# Patient Record
Sex: Male | Born: 1965 | Race: White | Hispanic: No | Marital: Married | Smoking: Never smoker
Health system: Southern US, Community
[De-identification: ages and names within clinical notes are randomized; demographics above are authoritative.]

## PROBLEM LIST (undated history)

## (undated) DIAGNOSIS — E7800 Pure hypercholesterolemia, unspecified: Secondary | ICD-10-CM

## (undated) DIAGNOSIS — S93432A Sprain of tibiofibular ligament of left ankle, initial encounter: Secondary | ICD-10-CM

## (undated) DIAGNOSIS — E785 Hyperlipidemia, unspecified: Secondary | ICD-10-CM

## (undated) DIAGNOSIS — T8859XA Other complications of anesthesia, initial encounter: Secondary | ICD-10-CM

## (undated) DIAGNOSIS — G4730 Sleep apnea, unspecified: Secondary | ICD-10-CM

## (undated) DIAGNOSIS — Z9889 Other specified postprocedural states: Secondary | ICD-10-CM

## (undated) DIAGNOSIS — M1990 Unspecified osteoarthritis, unspecified site: Secondary | ICD-10-CM

## (undated) DIAGNOSIS — E119 Type 2 diabetes mellitus without complications: Secondary | ICD-10-CM

## (undated) DIAGNOSIS — T4145XA Adverse effect of unspecified anesthetic, initial encounter: Secondary | ICD-10-CM

## (undated) DIAGNOSIS — R112 Nausea with vomiting, unspecified: Secondary | ICD-10-CM

## (undated) HISTORY — PX: CYST REMOVAL HAND: SHX6279

---

## 2017-06-27 ENCOUNTER — Emergency Department (HOSPITAL_COMMUNITY): Payer: Worker's Compensation

## 2017-06-27 ENCOUNTER — Other Ambulatory Visit: Payer: Self-pay

## 2017-06-27 ENCOUNTER — Encounter (HOSPITAL_COMMUNITY): Payer: Self-pay | Admitting: Emergency Medicine

## 2017-06-27 ENCOUNTER — Emergency Department (HOSPITAL_COMMUNITY)
Admission: EM | Admit: 2017-06-27 | Discharge: 2017-06-27 | Disposition: A | Discharge status: Home / Self Care | Payer: Worker's Compensation | Attending: Emergency Medicine | Admitting: Emergency Medicine

## 2017-06-27 DIAGNOSIS — X509XXA Other and unspecified overexertion or strenuous movements or postures, initial encounter: Secondary | ICD-10-CM | POA: Diagnosis not present

## 2017-06-27 DIAGNOSIS — Y9389 Activity, other specified: Secondary | ICD-10-CM | POA: Insufficient documentation

## 2017-06-27 DIAGNOSIS — S82891A Other fracture of right lower leg, initial encounter for closed fracture: Secondary | ICD-10-CM

## 2017-06-27 DIAGNOSIS — S99911A Unspecified injury of right ankle, initial encounter: Secondary | ICD-10-CM | POA: Diagnosis present

## 2017-06-27 DIAGNOSIS — E119 Type 2 diabetes mellitus without complications: Secondary | ICD-10-CM | POA: Diagnosis not present

## 2017-06-27 DIAGNOSIS — Y92812 Truck as the place of occurrence of the external cause: Secondary | ICD-10-CM | POA: Insufficient documentation

## 2017-06-27 DIAGNOSIS — Y998 Other external cause status: Secondary | ICD-10-CM | POA: Insufficient documentation

## 2017-06-27 HISTORY — DX: Type 2 diabetes mellitus without complications: E11.9

## 2017-06-27 HISTORY — DX: Hyperlipidemia, unspecified: E78.5

## 2017-06-27 LAB — CBG MONITORING, ED: GLUCOSE-CAPILLARY: 195 mg/dL — AB (ref 65–99)

## 2017-06-27 MED ORDER — IBUPROFEN 600 MG PO TABS: 600.0000 mg | ORAL_TABLET | Freq: Four times a day (QID) | ORAL | 0 refills | Status: DC | PRN

## 2017-06-27 MED ORDER — HYDROCODONE-ACETAMINOPHEN 5-325 MG PO TABS: 1.0000 | ORAL_TABLET | Freq: Four times a day (QID) | ORAL | 0 refills | Status: DC | PRN

## 2017-06-27 NOTE — Discharge Instructions (Signed)
Ibuprofen for pain.  Norco for severe pain as needed.  Follow-up with orthopedic specialist next week for recheck.  Keep your ankle elevated, ice several times a day.  Use crutches for ambulation

## 2017-06-27 NOTE — Progress Notes (Signed)
Orthopedic Tech Progress Note Patient Details:  Eric Matthews 27-Aug-1965 [id number]  Ortho Devices Type of Ortho Device: Ace wrap, Post (short leg) splint Ortho Device/Splint Location: rle Ortho Device/Splint Interventions: Application   Post Interventions Instructions Provided: Care of device   Eric Matthews 06/27/2017, 8:51 AM Pt already has crutches; RN notified

## 2017-06-27 NOTE — ED Triage Notes (Signed)
Pt. Stated a bar slipped out of rachet , the bar fell right on my leg right above my ankle. My leg folded back.

## 2017-06-27 NOTE — ED Notes (Signed)
Patient transported to X-ray 

## 2017-06-27 NOTE — ED Notes (Signed)
Pt doesn't want pain meds at this time.

## 2017-06-27 NOTE — ED Provider Notes (Signed)
[facility] Provider Note   CSN: [id number] Arrival date & time: 06/27/17  [id number]     History   Chief Complaint Chief Complaint  Patient presents with  . Leg Pain  . Leg Injury    HPI Conception OmsChris Hofman is a 51 y.o. male.  HPI Conception OmsChris Belger is a 51 y.o. male presents to emergency department complaining of right ankle pain.  Patient states that he was tying the strap on his truck this morning, when a bar fell and he states he stepped out of the truck the wrong way twisting his ankle.  He states the bar did not hit him.  He states he felt like his leg bent in half at the ankle joint.  He is reporting pain and swelling of the ankle, denies any numbness or weakness to the foot.  He denies any pain at his knee.  He denies any other injuries.  No prior injury to the same leg.  Past Medical History:  Diagnosis Date  . Diabetes mellitus without complication (HCC)   . Hyperlipidemia with target low density lipoprotein (LDL) cholesterol less than 100 mg/dL     There are no active problems to display for this patient.   History reviewed. No pertinent surgical history.     Home Medications    Prior to Admission medications   Not on File    Family History No family history on file.  Social History Social History   Tobacco Use  . Smoking status: Never Smoker  . Smokeless tobacco: Never Used  Substance Use Topics  . Alcohol use: Yes  . Drug use: No     Allergies   Patient has no allergy information on record.   Review of Systems Review of Systems  Constitutional: Negative for chills and fever.  Musculoskeletal: Positive for arthralgias and joint swelling.  Neurological: Negative for weakness and numbness.     Physical Exam Updated Vital Signs BP 138/89 (BP Location: Left Arm)   Pulse 85   Temp 98.7 F (37.1 C) (Oral)   Resp 18   Ht 5\' 9"  (1.753 m)   Wt 112.9 kg (249 lb)   SpO2 99%   BMI 36.77 kg/m   Physical Exam    Constitutional: He appears well-developed and well-nourished. No distress.  Eyes: Conjunctivae are normal.  Neck: Neck supple.  Cardiovascular: Normal rate.  Pulmonary/Chest: No respiratory distress.  Abdominal: He exhibits no distension.  Musculoskeletal:  Right ankle swelling over lateral malleolus. Tender to palpation over lateral malleolus. No tenderness to palpation over medial malleolus. Joint stable with negative anterior or posterior drawer signs. Pain with ankle dorsiflexion, plantarflexion, inversion. Normal foot exam with no swelling, tenderness.     Skin: Skin is warm and dry.  Nursing note and vitals reviewed.    ED Treatments / Results  Labs (all labs ordered are listed, but only abnormal results are displayed) Labs Reviewed  CBG MONITORING, ED - Abnormal; Notable for the following components:      Result Value   Glucose-Capillary 195 (*)    All other components within normal limits    EKG  EKG Interpretation None       Radiology Dg Ankle Complete Right  Result Date: 06/27/2017 CLINICAL DATA:  Pain following twisting injury EXAM: RIGHT ANKLE - COMPLETE 3+ VIEW COMPARISON:  None. FINDINGS: Frontal, oblique, and lateral views were obtained. There is soft tissue swelling laterally. There is a spiral fracture of the distal fibular diaphysis extending to involve  the distal fibular diaphysis - metaphysis junction. Alignment near anatomic. No other fracture. No joint effusion. There is no appreciable joint space narrowing. No erosive change. There are posterior and inferior calcaneal spurs. IMPRESSION: Soft tissue swelling laterally with spiral fracture distal fibula. Alignment near anatomic. No other fracture. Ankle mortise appears intact. There are prominent calcaneal spurs. Electronically Signed   By: Bretta BangWilliam  Woodruff III M.D.   On: 06/27/2017 08:05    Procedures Procedures (including critical care time)  Medications Ordered in ED Medications - No data to  display   Initial Impression / Assessment and Plan / ED Course  I have reviewed the triage vital signs and the nursing notes.  Pertinent labs & imaging results that were available during my care of the patient were reviewed by me and considered in my medical decision making (see chart for details).     Patient in the emergency department with a right ankle injury.  He is neurovascularly intact.  Will get x-rays to rule out fracture.  X-ray showing nondisplaced spiral fracture of the right distal fibula.  Patient placed in a splint, crutches provided.  Home with pain medications, follow-up with orthopedics.  Neurovascularly intact at time of discharge.  Vitals:   06/27/17 0722 06/27/17 0854  BP: 138/89 124/89  Pulse: 85   Resp: 18   Temp: 98.7 F (37.1 C)   TempSrc: Oral   SpO2: 99%   Weight: 112.9 kg (249 lb)   Height: 5\' 9"  (1.753 m)      Final Clinical Impressions(s) / ED Diagnoses   Final diagnoses:  Closed fracture of right ankle, initial encounter    ED Discharge Orders    None       Jaynie CrumbleKirichenko, Kasch Borquez, PA-C 06/27/17 1515    Mesner, Barbara CowerJason, MD 06/27/17 1702

## 2018-06-10 ENCOUNTER — Emergency Department (HOSPITAL_COMMUNITY)
Admission: EM | Admit: 2018-06-10 | Discharge: 2018-06-10 | Disposition: A | Discharge status: Home / Self Care | Payer: PRIVATE HEALTH INSURANCE | Attending: Emergency Medicine | Admitting: Emergency Medicine

## 2018-06-10 ENCOUNTER — Emergency Department (HOSPITAL_COMMUNITY): Payer: PRIVATE HEALTH INSURANCE

## 2018-06-10 ENCOUNTER — Encounter (HOSPITAL_COMMUNITY): Payer: Self-pay | Admitting: *Deleted

## 2018-06-10 DIAGNOSIS — E785 Hyperlipidemia, unspecified: Secondary | ICD-10-CM | POA: Diagnosis not present

## 2018-06-10 DIAGNOSIS — S82392A Other fracture of lower end of left tibia, initial encounter for closed fracture: Secondary | ICD-10-CM

## 2018-06-10 DIAGNOSIS — S8255XA Nondisplaced fracture of medial malleolus of left tibia, initial encounter for closed fracture: Secondary | ICD-10-CM | POA: Diagnosis not present

## 2018-06-10 DIAGNOSIS — Y9289 Other specified places as the place of occurrence of the external cause: Secondary | ICD-10-CM | POA: Diagnosis not present

## 2018-06-10 DIAGNOSIS — Y33XXXA Other specified events, undetermined intent, initial encounter: Secondary | ICD-10-CM | POA: Insufficient documentation

## 2018-06-10 DIAGNOSIS — Y990 Civilian activity done for income or pay: Secondary | ICD-10-CM | POA: Diagnosis not present

## 2018-06-10 DIAGNOSIS — S8992XA Unspecified injury of left lower leg, initial encounter: Secondary | ICD-10-CM | POA: Diagnosis present

## 2018-06-10 DIAGNOSIS — E119 Type 2 diabetes mellitus without complications: Secondary | ICD-10-CM | POA: Insufficient documentation

## 2018-06-10 DIAGNOSIS — S82445A Nondisplaced spiral fracture of shaft of left fibula, initial encounter for closed fracture: Secondary | ICD-10-CM | POA: Insufficient documentation

## 2018-06-10 DIAGNOSIS — Y9389 Activity, other specified: Secondary | ICD-10-CM | POA: Insufficient documentation

## 2018-06-10 MED ORDER — HYDROCODONE-ACETAMINOPHEN 5-325 MG PO TABS: 1.0000 | ORAL_TABLET | Freq: Four times a day (QID) | ORAL | 0 refills | Status: AC | PRN

## 2018-06-10 NOTE — ED Triage Notes (Signed)
Pt in after fall at work, he tripped on a bottom step and rolled his left ankle, c/o pain to his left lower leg

## 2018-06-10 NOTE — Progress Notes (Signed)
Orthopedic Tech Progress Note Patient Details:  Conception OmsChris Eberle August 05, 1965 [id number] PA order short leg splint. Ortho Devices Type of Ortho Device: Crutches, Short leg splint Ortho Device/Splint Location: LLE Ortho Device/Splint Interventions: Ordered, Application, Adjustment   Post Interventions Patient Tolerated: Well Instructions Provided: Care of device   Jennye MoccasinHughes, Fynn Adel Craig 06/10/2018, 12:25 PM

## 2018-06-10 NOTE — ED Notes (Signed)
Patient transported to CT 

## 2018-06-10 NOTE — ED Triage Notes (Signed)
Ortho tech in room to apply splint 

## 2018-06-10 NOTE — ED Notes (Signed)
Pt to xray

## 2018-06-10 NOTE — ED Provider Notes (Signed)
[facility] EMERGENCY DEPARTMENT Provider Note   CSN: [id number] Arrival date & time: 06/10/18  0706     History   Chief Complaint Chief Complaint  Patient presents with  . Fall    HPI Eric Matthews is a 52 y.o. male.  52 year old male presents with left lower leg injury.  Patient states that he was carrying a box down a flight of stairs at work and when he got to the bottom step there was some wood on the steps that caused him to roll his left ankle and fall.  Patient reports pain to the mid to distal left lower leg and lateral ankle.  Patient denies hitting his head, any injury of the neck or back, any pain in the upper extremities.  No other complaints or concerns.  Patient states that he already took 1 g of Tylenol and 2 ibuprofen this morning before work.     Past Medical History:  Diagnosis Date  . Diabetes mellitus without complication (HCC)   . Hyperlipidemia with target low density lipoprotein (LDL) cholesterol less than 100 mg/dL     There are no active problems to display for this patient.   History reviewed. No pertinent surgical history.      Home Medications    Prior to Admission medications   Medication Sig Start Date End Date Taking? Authorizing Provider  HYDROcodone-acetaminophen (NORCO) 5-325 MG tablet Take 1 tablet by mouth every 6 (six) hours as needed for moderate pain. 06/10/18   Jeannie Fend, PA-C  ibuprofen (ADVIL,MOTRIN) 600 MG tablet Take 1 tablet (600 mg total) by mouth every 6 (six) hours as needed. 06/27/17   Jaynie Crumble, PA-C    Family History History reviewed. No pertinent family history.  Social History Social History   Tobacco Use  . Smoking status: Never Smoker  . Smokeless tobacco: Never Used  Substance Use Topics  . Alcohol use: Yes  . Drug use: No     Allergies   Patient has no known allergies.   Review of Systems Review of Systems  Constitutional: Negative for fever.  Musculoskeletal:  Positive for arthralgias, gait problem and myalgias. Negative for back pain, neck pain and neck stiffness.  Skin: Negative for rash and wound.  Allergic/Immunologic: Positive for immunocompromised state.  Neurological: Negative for weakness and numbness.  Hematological: Does not bruise/bleed easily.  Psychiatric/Behavioral: Negative for confusion.  All other systems reviewed and are negative.    Physical Exam Updated Vital Signs BP (!) 148/87 (BP Location: Right Arm)   Pulse 73   Temp 98.5 F (36.9 C) (Oral)   Resp 16   SpO2 97%   Physical Exam  Constitutional: He is oriented to person, place, and time. He appears well-developed and well-nourished. No distress.  HENT:  Head: Normocephalic and atraumatic.  Cardiovascular: Intact distal pulses.  Pulmonary/Chest: Effort normal.  Musculoskeletal: He exhibits tenderness. He exhibits no deformity.       Left knee: Normal.       Left ankle: He exhibits decreased range of motion and ecchymosis. He exhibits no swelling, no deformity, no laceration and normal pulse. Tenderness. Lateral malleolus tenderness found. No medial malleolus, no head of 5th metatarsal and no proximal fibula tenderness found.       Feet:  Neurological: He is alert and oriented to person, place, and time. No sensory deficit.  Skin: Skin is warm and dry. He is not diaphoretic. No erythema.  Psychiatric: He has a normal mood and affect. His behavior is  normal.  Nursing note and vitals reviewed.    ED Treatments / Results  Labs (all labs ordered are listed, but only abnormal results are displayed) Labs Reviewed - No data to display  EKG None  Radiology Dg Tibia/fibula Left  Result Date: 06/10/2018 CLINICAL DATA:  Status post fall at work at which time his left leg folded under him. EXAM: LEFT TIBIA AND FIBULA - 2 VIEW COMPARISON:  Left ankle series of today's date FINDINGS: There's a mildly distracted spiral fracture of the proximal fibular diaphysis. More  distally the fibula is intact. There's disruption of the ankle joint mortise and a probable impacted fracture of the medial aspect of the the talus. The tibia appears intact. IMPRESSION: Acute spiral fracture of the proximal 1/3 of the shaft of the fibula. Disruption of the ankle joint mortise with probable impacted fracture of the medial aspect of the talus. Electronically Signed   By: David  [place] M.D.   On: 06/10/2018 08:48   Dg Ankle Complete Left  Result Date: 06/10/2018 CLINICAL DATA:  Tripped and fell over would with result that his leg folded under him. He now has pain and swelling over the left lower leg and left ankle. EXAM: LEFT ANKLE COMPLETE - 3+ VIEW COMPARISON:  None. FINDINGS: There's disruption of the ankle joint mortise. There are bony densities inferior to the lateral malleolus. A donor site is likely present from the medial aspect of the talus. No acute malleolar fracture is observed. A spur is present at the site of the posterior malleolus. The talus elsewhere and the calcaneus are normal. There are plantar and Achilles region calcaneal spurs. IMPRESSION: There is disruption of the ankle joint mortise and likely an impaction type fracture from the medial aspect of the talus is present. There is a large amount of soft tissue swelling. No fracture of the tibia or fibula is observed on this ankle series. However, on the accompanying tibia and fibula series there is a mildly displaced spiral fracture of the proximal shaft of the fibula. Electronically Signed   By: David  [place] M.D.   On: 06/10/2018 08:35   Ct Ankle Left Wo Contrast  Result Date: 06/10/2018 CLINICAL DATA:  Fall from steps.  Left ankle fracture. EXAM: CT OF THE LEFT ANKLE WITHOUT CONTRAST TECHNIQUE: Multidetector CT imaging of the left ankle was performed according to the standard protocol. Multiplanar CT image reconstructions were also generated. COMPARISON:  Radiographs same date. FINDINGS: Bones/Joint/Cartilage There is  a comminuted and mildly displaced intra-articular fracture involving the posterior aspect of the tibial plafond. This fracture extends towards the base of the medial malleolus, although does not involve it. Well corticated ossific density distal to the medial malleolus appears nonacute. There is no extension of the fracture into the distal tibiofibular joint which is mildly widened. There is also mild widening of the interval between the medial malleolus and the talus. Slightly angulated 4 mm ossific density anterior to the distal fibula may reflect an acute avulsion fracture, probably mediated by the anterior inferior tibiofibular ligament. No clear donor site identified. The distal fibula otherwise appears normal. There are possible tiny avulsion fractures along the navicular tuberosity. No other acute tarsal bone fractures are identified. The talar dome is intact. There are mild midfoot degenerative changes with fragmented osteophytes. Degenerative changes are present within the subtalar joint. Ligaments Suboptimally assessed by CT. As above, possible lateral avulsion fracture mediated by the anterior inferior tibiofibular ligament. Grossly intact anterior talofibular ligament. Muscles and Tendons The ankle tendons  appear intact without evidence of entrapment. Soft tissues Moderate medial and mild lateral soft tissue swelling without focal hematoma. IMPRESSION: 1. Comminuted and mildly displaced intra-articular fracture of the posterior tibial plafond peripherally. 2. Possible small avulsion fracture anterolaterally without clear donor site. Mild associated widening of the ankle mortise, suggesting possible ligamentous injury. 3. Possible tiny avulsion fractures from the navicular tuberosity. Intact talar dome and additional tarsal bones. 4. Hindfoot and midfoot degenerative changes. Electronically Signed   By: Carey BullocksWilliam  Veazey M.D.   On: 06/10/2018 11:15    Procedures Procedures (including critical care  time)  Medications Ordered in ED Medications - No data to display   Initial Impression / Assessment and Plan / ED Course  I have reviewed the triage vital signs and the nursing notes.  Pertinent labs & imaging results that were available during my care of the patient were reviewed by me and considered in my medical decision making (see chart for details).  Clinical Course as of Jun 10 1205  Wed Jun 10, 2018  1202 52yo male with left lower leg/ankle injury after a fall. TTP mid to distal lateral lower leg and lateral left ankle, DP pulse present, NVI, no open wounds. XR with proximal left fibula shaft fracture, ankle fracture. Discussed with Charma IgoMichael Jeffery, PA-C with orthopedics who has seen the patient, ordered and reviewed CT ankle. Plan is to place in posterior short leg splint with stirrup and follow up. Given rx for Norco (after review of narcotics database, no recent rx). Patient given dc instructions, will follow up with ortho.    [LM]    Clinical Course User Index [LM] Jeannie FendMurphy, Tiegan Terpstra A, PA-C   Final Clinical Impressions(s) / ED Diagnoses   Final diagnoses:  Closed nondisplaced spiral fracture of shaft of left fibula, initial encounter  Closed fracture of posterior malleolus of left tibia, initial encounter    ED Discharge Orders         Ordered    HYDROcodone-acetaminophen (NORCO) 5-325 MG tablet  Every 6 hours PRN     06/10/18 1146           Alden HippMurphy, Anjana Cheek A, PA-C 06/10/18 1206    Pricilla LovelessGoldston, Scott, MD 06/10/18 1549

## 2018-06-10 NOTE — Discharge Instructions (Signed)
Keep leg elevated when possible.  Ice over splint for 30 minutes 4x/day for 3 days.  Keep splint intact and dry.  Do not put weight down on left leg.

## 2018-06-10 NOTE — Consult Note (Addendum)
Reason for Consult:Left ankle fx Referring Physician: Clemens Lachman is an 52 y.o. male.  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 He thinks his foot everted and his leg collapsed under him. He had immediate left lower leg and ankle pain. He came to the ED for evaluation where x-rays showed a proximal fibular shaft fx and a talus fx and orthopedic surgery was consulted. He works as a [employer] architect. He had a right ankle fx last year that was treated (non-operatively) by Dr. Darrelyn Hillock at [facility] and would like to stay with them if possible.  Past Medical History:  Diagnosis Date  . Diabetes mellitus without complication (HCC)   . Hyperlipidemia with target low density lipoprotein (LDL) cholesterol less than 100 mg/dL     History reviewed. No pertinent surgical history.  History reviewed. No pertinent family history.  Social History:  reports that he has never smoked. He has never used smokeless tobacco. He reports that he drinks alcohol. He reports that he does not use drugs.  Allergies: No Known Allergies  Medications: I have reviewed the patient's current medications.  No results found for this or any previous visit (from the past 48 hour(s)).  Dg Tibia/fibula Left  Result Date: 06/10/2018 CLINICAL DATA:  Status post fall at work at which time his left leg folded under him. EXAM: LEFT TIBIA AND FIBULA - 2 VIEW COMPARISON:  Left ankle series of today's date FINDINGS: There's a mildly distracted spiral fracture of the proximal fibular diaphysis. More distally the fibula is intact. There's disruption of the ankle joint mortise and a probable impacted fracture of the medial aspect of the the talus. The tibia appears intact. IMPRESSION: Acute spiral fracture of the proximal 1/3 of the shaft of the fibula. Disruption of the ankle joint mortise with probable impacted fracture of the medial aspect of the talus. Electronically  Signed   By: David  [place] M.D.   On: 06/10/2018 08:48   Dg Ankle Complete Left  Result Date: 06/10/2018 CLINICAL DATA:  Tripped and fell over would with result that his leg folded under him. He now has pain and swelling over the left lower leg and left ankle. EXAM: LEFT ANKLE COMPLETE - 3+ VIEW COMPARISON:  None. FINDINGS: There's disruption of the ankle joint mortise. There are bony densities inferior to the lateral malleolus. A donor site is likely present from the medial aspect of the talus. No acute malleolar fracture is observed. A spur is present at the site of the posterior malleolus. The talus elsewhere and the calcaneus are normal. There are plantar and Achilles region calcaneal spurs. IMPRESSION: There is disruption of the ankle joint mortise and likely an impaction type fracture from the medial aspect of the talus is present. There is a large amount of soft tissue swelling. No fracture of the tibia or fibula is observed on this ankle series. However, on the accompanying tibia and fibula series there is a mildly displaced spiral fracture of the proximal shaft of the fibula. Electronically Signed   By: David  [place] M.D.   On: 06/10/2018 08:35    Review of Systems  Constitutional: Negative for weight loss.  HENT: Negative for ear discharge, ear pain, hearing loss and tinnitus.   Eyes: Negative for blurred vision, double vision, photophobia and pain.  Respiratory: Negative for cough, sputum production and shortness of breath.   Cardiovascular: Negative for chest pain.  Gastrointestinal:  Negative for abdominal pain, nausea and vomiting.  Genitourinary: Negative for dysuria, flank pain, frequency and urgency.  Musculoskeletal: Positive for joint pain (Left ankle, leg). Negative for back pain, falls, myalgias and neck pain.  Neurological: Negative for dizziness, tingling, sensory change, focal weakness, loss of consciousness and headaches.  Endo/Heme/Allergies: Does not bruise/bleed easily.   Psychiatric/Behavioral: Negative for depression, memory loss and substance abuse. The patient is not nervous/anxious.    Blood pressure (!) 148/87, pulse 73, temperature 98.5 F (36.9 C), temperature source Oral, resp. rate 16, SpO2 97 %. Physical Exam  Constitutional: He appears well-developed and well-nourished. No distress.  HENT:  Head: Normocephalic and atraumatic.  Eyes: Conjunctivae are normal. Right eye exhibits no discharge. Left eye exhibits no discharge. No scleral icterus.  Neck: Normal range of motion.  Cardiovascular: Normal rate and regular rhythm.  Respiratory: Effort normal. No respiratory distress.  Musculoskeletal:  LLE No traumatic wounds, ecchymosis, or rash  Mild TTP lateral lower leg, mod-severe TTP ankle, mod edema  No knee effusion  Knee stable to varus/ valgus and anterior/posterior stress  Sens DPN, SPN, TN intact  Motor EHL, ext, flex, evers 5/5  DP 2+, PT 0 (likely 2/2 edema)  Neurological: He is alert.  Skin: Skin is warm and dry. He is not diaphoretic.  Psychiatric: He has a normal mood and affect. His behavior is normal.    Assessment/Plan: Left maissoneuve fx with posterior malleolus fx -- Splint, NWB, will have f/u with Dr. Victorino DikeHewitt within 1 week for planning surgery    Freeman CaldronMichael J. Jeffery, PA-C Orthopedic Surgery [phone] 06/10/2018, 9:35 AM

## 2018-06-17 ENCOUNTER — Other Ambulatory Visit (HOSPITAL_COMMUNITY): Payer: Self-pay | Admitting: Orthopedic Surgery

## 2018-06-17 ENCOUNTER — Encounter (HOSPITAL_BASED_OUTPATIENT_CLINIC_OR_DEPARTMENT_OTHER): Payer: Self-pay | Admitting: *Deleted

## 2018-06-17 ENCOUNTER — Other Ambulatory Visit: Payer: Self-pay

## 2018-06-19 ENCOUNTER — Ambulatory Visit (HOSPITAL_BASED_OUTPATIENT_CLINIC_OR_DEPARTMENT_OTHER): Payer: PRIVATE HEALTH INSURANCE | Admitting: Anesthesiology

## 2018-06-19 ENCOUNTER — Encounter (HOSPITAL_BASED_OUTPATIENT_CLINIC_OR_DEPARTMENT_OTHER)
Admission: RE | Disposition: A | Discharge status: Home / Self Care | Payer: Self-pay | Source: Home / Self Care | Attending: Orthopedic Surgery

## 2018-06-19 ENCOUNTER — Ambulatory Visit (HOSPITAL_BASED_OUTPATIENT_CLINIC_OR_DEPARTMENT_OTHER)
Admission: RE | Admit: 2018-06-19 | Discharge: 2018-06-19 | Disposition: A | Discharge status: Home / Self Care | Payer: PRIVATE HEALTH INSURANCE | Attending: Orthopedic Surgery | Admitting: Orthopedic Surgery

## 2018-06-19 ENCOUNTER — Other Ambulatory Visit: Payer: Self-pay

## 2018-06-19 ENCOUNTER — Encounter (HOSPITAL_BASED_OUTPATIENT_CLINIC_OR_DEPARTMENT_OTHER): Payer: Self-pay | Admitting: *Deleted

## 2018-06-19 DIAGNOSIS — Z7982 Long term (current) use of aspirin: Secondary | ICD-10-CM | POA: Insufficient documentation

## 2018-06-19 DIAGNOSIS — W19XXXA Unspecified fall, initial encounter: Secondary | ICD-10-CM | POA: Diagnosis not present

## 2018-06-19 DIAGNOSIS — Z79899 Other long term (current) drug therapy: Secondary | ICD-10-CM | POA: Insufficient documentation

## 2018-06-19 DIAGNOSIS — G4730 Sleep apnea, unspecified: Secondary | ICD-10-CM | POA: Diagnosis not present

## 2018-06-19 DIAGNOSIS — E785 Hyperlipidemia, unspecified: Secondary | ICD-10-CM | POA: Insufficient documentation

## 2018-06-19 DIAGNOSIS — M1990 Unspecified osteoarthritis, unspecified site: Secondary | ICD-10-CM | POA: Diagnosis not present

## 2018-06-19 DIAGNOSIS — E119 Type 2 diabetes mellitus without complications: Secondary | ICD-10-CM | POA: Diagnosis not present

## 2018-06-19 DIAGNOSIS — Y939 Activity, unspecified: Secondary | ICD-10-CM | POA: Insufficient documentation

## 2018-06-19 DIAGNOSIS — S82442A Displaced spiral fracture of shaft of left fibula, initial encounter for closed fracture: Secondary | ICD-10-CM | POA: Insufficient documentation

## 2018-06-19 DIAGNOSIS — E7800 Pure hypercholesterolemia, unspecified: Secondary | ICD-10-CM | POA: Insufficient documentation

## 2018-06-19 DIAGNOSIS — Y990 Civilian activity done for income or pay: Secondary | ICD-10-CM | POA: Diagnosis not present

## 2018-06-19 DIAGNOSIS — Y929 Unspecified place or not applicable: Secondary | ICD-10-CM | POA: Insufficient documentation

## 2018-06-19 DIAGNOSIS — Z7984 Long term (current) use of oral hypoglycemic drugs: Secondary | ICD-10-CM | POA: Insufficient documentation

## 2018-06-19 DIAGNOSIS — S93432A Sprain of tibiofibular ligament of left ankle, initial encounter: Secondary | ICD-10-CM | POA: Diagnosis present

## 2018-06-19 DIAGNOSIS — X501XXA Overexertion from prolonged static or awkward postures, initial encounter: Secondary | ICD-10-CM | POA: Insufficient documentation

## 2018-06-19 HISTORY — PX: LIGAMENT REPAIR: SHX5444

## 2018-06-19 HISTORY — DX: Other specified postprocedural states: R11.2

## 2018-06-19 HISTORY — DX: Other complications of anesthesia, initial encounter: T88.59XA

## 2018-06-19 HISTORY — DX: Unspecified osteoarthritis, unspecified site: M19.90

## 2018-06-19 HISTORY — PX: SYNDESMOSIS REPAIR: SHX5182

## 2018-06-19 HISTORY — DX: Adverse effect of unspecified anesthetic, initial encounter: T41.45XA

## 2018-06-19 HISTORY — DX: Sleep apnea, unspecified: G47.30

## 2018-06-19 HISTORY — DX: Nausea with vomiting, unspecified: Z98.890

## 2018-06-19 HISTORY — DX: Pure hypercholesterolemia, unspecified: E78.00

## 2018-06-19 HISTORY — DX: Sprain of tibiofibular ligament of left ankle, initial encounter: S93.432A

## 2018-06-19 LAB — GLUCOSE, CAPILLARY: Glucose-Capillary: 124 mg/dL — ABNORMAL HIGH (ref 70–99)

## 2018-06-19 SURGERY — REPAIR, LIGAMENT
Anesthesia: General | Site: Ankle | Laterality: Left

## 2018-06-19 MED ORDER — DEXAMETHASONE SODIUM PHOSPHATE 10 MG/ML IJ SOLN
INTRAMUSCULAR | Status: AC
  Filled 2018-06-19: qty 1

## 2018-06-19 MED ORDER — CEFAZOLIN SODIUM-DEXTROSE 2-4 GM/100ML-% IV SOLN
INTRAVENOUS | Status: AC
  Filled 2018-06-19: qty 100

## 2018-06-19 MED ORDER — HYDROMORPHONE HCL 1 MG/ML IJ SOLN
INTRAMUSCULAR | Status: AC
  Filled 2018-06-19: qty 0.5

## 2018-06-19 MED ORDER — LACTATED RINGERS IV SOLN
INTRAVENOUS | Status: DC
  Administered 2018-06-19: 12:00:00 via INTRAVENOUS

## 2018-06-19 MED ORDER — FENTANYL CITRATE (PF) 100 MCG/2ML IJ SOLN
50.0000 ug | INTRAMUSCULAR | Status: AC | PRN
  Administered 2018-06-19: 100 ug via INTRAVENOUS
  Administered 2018-06-19: 25 ug via INTRAVENOUS
  Administered 2018-06-19: 50 ug via INTRAVENOUS

## 2018-06-19 MED ORDER — EPHEDRINE SULFATE 50 MG/ML IJ SOLN
INTRAMUSCULAR | Status: DC | PRN
  Administered 2018-06-19: 10 mg via INTRAVENOUS

## 2018-06-19 MED ORDER — FENTANYL CITRATE (PF) 100 MCG/2ML IJ SOLN: 25.0000 ug | INTRAMUSCULAR | Status: DC | PRN

## 2018-06-19 MED ORDER — MIDAZOLAM HCL 2 MG/2ML IJ SOLN
INTRAMUSCULAR | Status: AC
  Filled 2018-06-19: qty 2

## 2018-06-19 MED ORDER — ONDANSETRON HCL 4 MG/2ML IJ SOLN
INTRAMUSCULAR | Status: AC
  Filled 2018-06-19: qty 2

## 2018-06-19 MED ORDER — ONDANSETRON HCL 4 MG/2ML IJ SOLN: 4.0000 mg | Freq: Once | INTRAMUSCULAR | Status: DC | PRN

## 2018-06-19 MED ORDER — BUPIVACAINE HCL (PF) 0.5 % IJ SOLN
INTRAMUSCULAR | Status: DC | PRN
  Administered 2018-06-19: 40 mL via PERINEURAL

## 2018-06-19 MED ORDER — MIDAZOLAM HCL 2 MG/2ML IJ SOLN
1.0000 mg | INTRAMUSCULAR | Status: DC | PRN
  Administered 2018-06-19: 2 mg via INTRAVENOUS

## 2018-06-19 MED ORDER — CEFAZOLIN SODIUM-DEXTROSE 2-4 GM/100ML-% IV SOLN
2.0000 g | INTRAVENOUS | Status: AC
  Administered 2018-06-19: 2 g via INTRAVENOUS

## 2018-06-19 MED ORDER — PROPOFOL 10 MG/ML IV BOLUS
INTRAVENOUS | Status: AC
  Filled 2018-06-19: qty 20

## 2018-06-19 MED ORDER — FENTANYL CITRATE (PF) 100 MCG/2ML IJ SOLN
INTRAMUSCULAR | Status: AC
  Filled 2018-06-19: qty 2

## 2018-06-19 MED ORDER — CHLORHEXIDINE GLUCONATE 4 % EX LIQD: 60.0000 mL | Freq: Once | CUTANEOUS | Status: DC

## 2018-06-19 MED ORDER — SODIUM CHLORIDE 0.9 % IV SOLN: INTRAVENOUS | Status: DC

## 2018-06-19 MED ORDER — CLONIDINE HCL (ANALGESIA) 100 MCG/ML EP SOLN
EPIDURAL | Status: DC | PRN
  Administered 2018-06-19: 100 ug

## 2018-06-19 MED ORDER — HYDROCODONE-ACETAMINOPHEN 7.5-325 MG PO TABS: 1.0000 | ORAL_TABLET | Freq: Once | ORAL | Status: DC | PRN

## 2018-06-19 MED ORDER — ONDANSETRON HCL 4 MG/2ML IJ SOLN
INTRAMUSCULAR | Status: DC | PRN
  Administered 2018-06-19: 4 mg via INTRAVENOUS

## 2018-06-19 MED ORDER — MEPERIDINE HCL 25 MG/ML IJ SOLN: 6.2500 mg | INTRAMUSCULAR | Status: DC | PRN

## 2018-06-19 MED ORDER — SCOPOLAMINE 1 MG/3DAYS TD PT72
1.0000 | MEDICATED_PATCH | Freq: Once | TRANSDERMAL | Status: DC | PRN
  Administered 2018-06-19: 1.5 mg via TRANSDERMAL

## 2018-06-19 MED ORDER — ASPIRIN EC 81 MG PO TBEC: 81.0000 mg | DELAYED_RELEASE_TABLET | Freq: Every day | ORAL | Status: AC

## 2018-06-19 MED ORDER — DEXAMETHASONE SODIUM PHOSPHATE 10 MG/ML IJ SOLN
INTRAMUSCULAR | Status: DC | PRN
  Administered 2018-06-19: 5 mg via INTRAVENOUS

## 2018-06-19 MED ORDER — OXYCODONE HCL 5 MG PO TABS: 5.0000 mg | ORAL_TABLET | Freq: Once | ORAL | Status: DC

## 2018-06-19 MED ORDER — LIDOCAINE 2% (20 MG/ML) 5 ML SYRINGE
INTRAMUSCULAR | Status: DC | PRN
  Administered 2018-06-19: 60 mg via INTRAVENOUS

## 2018-06-19 MED ORDER — OXYCODONE HCL 5 MG PO TABS: 5.0000 mg | ORAL_TABLET | Freq: Three times a day (TID) | ORAL | 0 refills | Status: AC | PRN

## 2018-06-19 MED ORDER — HYDROMORPHONE HCL 1 MG/ML IJ SOLN
0.5000 mg | INTRAMUSCULAR | Status: DC | PRN
  Administered 2018-06-19 (×2): 0.5 mg via INTRAVENOUS

## 2018-06-19 MED ORDER — PROPOFOL 10 MG/ML IV BOLUS
INTRAVENOUS | Status: DC | PRN
  Administered 2018-06-19: 200 mg via INTRAVENOUS

## 2018-06-19 SURGICAL SUPPLY — 70 items
ANCHOR SUT 1.45 SZ 1 SHORT (Anchor) ×4 IMPLANT
BANDAGE ESMARK 6X9 LF (GAUZE/BANDAGES/DRESSINGS) ×2 IMPLANT
BLADE SURG 15 STRL LF DISP TIS (BLADE) ×4 IMPLANT
BLADE SURG 15 STRL SS (BLADE) ×4
BNDG COHESIVE 4X5 TAN STRL (GAUZE/BANDAGES/DRESSINGS) ×4 IMPLANT
BNDG COHESIVE 6X5 TAN STRL LF (GAUZE/BANDAGES/DRESSINGS) ×4 IMPLANT
BNDG ESMARK 4X9 LF (GAUZE/BANDAGES/DRESSINGS) IMPLANT
BNDG ESMARK 6X9 LF (GAUZE/BANDAGES/DRESSINGS) ×4
CANISTER SUCT 1200ML W/VALVE (MISCELLANEOUS) ×4 IMPLANT
CHLORAPREP W/TINT 26ML (MISCELLANEOUS) ×4 IMPLANT
COVER BACK TABLE 60X90IN (DRAPES) ×4 IMPLANT
COVER WAND RF STERILE (DRAPES) IMPLANT
CUFF TOURNIQUET SINGLE 34IN LL (TOURNIQUET CUFF) ×4 IMPLANT
DECANTER SPIKE VIAL GLASS SM (MISCELLANEOUS) IMPLANT
DEVICE FIXATION SYNDESMOSIS (Bone Implant) ×4 IMPLANT
DRAPE EXTREMITY T 121X128X90 (DRAPE) ×4 IMPLANT
DRAPE OEC MINIVIEW 54X84 (DRAPES) ×4 IMPLANT
DRAPE U-SHAPE 47X51 STRL (DRAPES) ×4 IMPLANT
DRSG MEPITEL 4X7.2 (GAUZE/BANDAGES/DRESSINGS) ×4 IMPLANT
DRSG PAD ABDOMINAL 8X10 ST (GAUZE/BANDAGES/DRESSINGS) ×8 IMPLANT
ELECT REM PT RETURN 9FT ADLT (ELECTROSURGICAL) ×4
ELECTRODE REM PT RTRN 9FT ADLT (ELECTROSURGICAL) ×2 IMPLANT
FIXATION ZIPTIGHT ANKLE SNDSMS (Ankle) ×2 IMPLANT
GAUZE SPONGE 4X4 12PLY STRL (GAUZE/BANDAGES/DRESSINGS) ×4 IMPLANT
GLOVE BIO SURGEON STRL SZ 6.5 (GLOVE) ×3 IMPLANT
GLOVE BIO SURGEON STRL SZ8 (GLOVE) ×4 IMPLANT
GLOVE BIO SURGEONS STRL SZ 6.5 (GLOVE) ×1
GLOVE BIOGEL PI IND STRL 7.0 (GLOVE) ×2 IMPLANT
GLOVE BIOGEL PI IND STRL 8 (GLOVE) ×4 IMPLANT
GLOVE BIOGEL PI INDICATOR 7.0 (GLOVE) ×2
GLOVE BIOGEL PI INDICATOR 8 (GLOVE) ×4
GLOVE ECLIPSE 8.0 STRL XLNG CF (GLOVE) ×4 IMPLANT
GOWN STRL REUS W/ TWL LRG LVL3 (GOWN DISPOSABLE) ×2 IMPLANT
GOWN STRL REUS W/ TWL XL LVL3 (GOWN DISPOSABLE) ×4 IMPLANT
GOWN STRL REUS W/TWL LRG LVL3 (GOWN DISPOSABLE) ×2
GOWN STRL REUS W/TWL XL LVL3 (GOWN DISPOSABLE) ×4
NEEDLE HYPO 22GX1.5 SAFETY (NEEDLE) IMPLANT
NS IRRIG 1000ML POUR BTL (IV SOLUTION) ×4 IMPLANT
PACK BASIN DAY SURGERY FS (CUSTOM PROCEDURE TRAY) ×4 IMPLANT
PAD CAST 4YDX4 CTTN HI CHSV (CAST SUPPLIES) ×2 IMPLANT
PADDING CAST ABS 4INX4YD NS (CAST SUPPLIES)
PADDING CAST ABS COTTON 4X4 ST (CAST SUPPLIES) IMPLANT
PADDING CAST COTTON 4X4 STRL (CAST SUPPLIES) ×2
PADDING CAST COTTON 6X4 STRL (CAST SUPPLIES) ×4 IMPLANT
PENCIL BUTTON HOLSTER BLD 10FT (ELECTRODE) ×4 IMPLANT
PLATE ACE 3.5MM 4HOLE (Plate) ×4 IMPLANT
SANITIZER HAND PURELL 535ML FO (MISCELLANEOUS) ×4 IMPLANT
SHEET MEDIUM DRAPE 40X70 STRL (DRAPES) ×4 IMPLANT
SLEEVE SCD COMPRESS KNEE MED (MISCELLANEOUS) ×4 IMPLANT
SPLINT FAST PLASTER 5X30 (CAST SUPPLIES) ×40
SPLINT PLASTER CAST FAST 5X30 (CAST SUPPLIES) ×40 IMPLANT
SPONGE LAP 18X18 RF (DISPOSABLE) ×4 IMPLANT
STOCKINETTE 6  STRL (DRAPES) ×2
STOCKINETTE 6 STRL (DRAPES) ×2 IMPLANT
SUCTION FRAZIER HANDLE 10FR (MISCELLANEOUS) ×2
SUCTION TUBE FRAZIER 10FR DISP (MISCELLANEOUS) ×2 IMPLANT
SUT ETHILON 3 0 PS 1 (SUTURE) ×4 IMPLANT
SUT FIBERWIRE #2 38 T-5 BLUE (SUTURE)
SUT MNCRL AB 3-0 PS2 18 (SUTURE) IMPLANT
SUT VIC AB 0 SH 27 (SUTURE) IMPLANT
SUT VIC AB 2-0 SH 27 (SUTURE) ×2
SUT VIC AB 2-0 SH 27XBRD (SUTURE) ×2 IMPLANT
SUTURE FIBERWR #2 38 T-5 BLUE (SUTURE) IMPLANT
SYR BULB 3OZ (MISCELLANEOUS) ×4 IMPLANT
SYR CONTROL 10ML LL (SYRINGE) IMPLANT
TOWEL GREEN STERILE FF (TOWEL DISPOSABLE) ×8 IMPLANT
TUBE CONNECTING 20'X1/4 (TUBING) ×1
TUBE CONNECTING 20X1/4 (TUBING) ×3 IMPLANT
UNDERPAD 30X30 (UNDERPADS AND DIAPERS) ×4 IMPLANT
ZIPTIGHT ANKLE SYNODESMOSS FIX (Ankle) ×4 IMPLANT

## 2018-06-19 NOTE — Anesthesia Procedure Notes (Signed)
Anesthesia Regional Block: Adductor canal block   Pre-Anesthetic Checklist: ,, timeout performed, Correct Patient, Correct Site, Correct Laterality, Correct Procedure, Correct Position, site marked, Risks and benefits discussed,  Surgical consent,  Pre-op evaluation,  At surgeon's request and post-op pain management  Laterality: Right  Prep: chloraprep       Needles:  Injection technique: Single-shot  Needle Type: Echogenic Needle     Needle Length: 9cm  Needle Gauge: 21     Additional Needles:   Procedures:,,,, ultrasound used (permanent image in chart),,,,  Narrative:  Start time: 06/19/2018 1:22 PM End time: 06/19/2018 1:27 PM Injection made incrementally with aspirations every 5 mL.  Performed by: Personally  Anesthesiologist: Cecile Hearingurk, Jerlyn Pain Edward, MD  Additional Notes: No pain on injection. No increased resistance to injection. Injection made in 5cc increments.  Good needle visualization.  Patient tolerated procedure well.

## 2018-06-19 NOTE — Progress Notes (Signed)
Assisted Dr. Turk with left, ultrasound guided, popliteal/saphenous block. Side rails up, monitors on throughout procedure. See vital signs in flow sheet. Tolerated Procedure well. 

## 2018-06-19 NOTE — Anesthesia Postprocedure Evaluation (Signed)
Anesthesia Post Note  Patient: Eric Matthews  Procedure(s) Performed: OPEN REDUCTION INTERNAL FIXATION (ORIF) left ankle syndesmosis; repair of deltoid ligament (Left Ankle) SYNDESMOSIS REPAIR left ankle (Left Ankle)     Patient location during evaluation: PACU Anesthesia Type: General Level of consciousness: awake and alert and oriented Pain management: pain level controlled Vital Signs Assessment: post-procedure vital signs reviewed and stable Respiratory status: spontaneous breathing, nonlabored ventilation and respiratory function stable Cardiovascular status: blood pressure returned to baseline and stable Postop Assessment: no apparent nausea or vomiting Anesthetic complications: no    Last Vitals:  Vitals:   06/19/18 1645 06/19/18 1700  BP: (!) 142/90 136/88  Pulse: 66 66  Resp: 19 14  Temp:    SpO2: 96% 96%    Last Pain:  Vitals:   06/19/18 1645  TempSrc:   PainSc: 7                  Jaleigh Mccroskey A.

## 2018-06-19 NOTE — Anesthesia Procedure Notes (Signed)
Procedure Name: LMA Insertion Date/Time: 06/19/2018 2:36 PM Performed by: Burna Cashonrad, Ho Parisi C, CRNA Pre-anesthesia Checklist: Patient identified, Emergency Drugs available, Suction available and Patient being monitored Patient Re-evaluated:Patient Re-evaluated prior to induction Oxygen Delivery Method: Circle system utilized Preoxygenation: Pre-oxygenation with 100% oxygen Induction Type: IV induction Ventilation: Mask ventilation without difficulty LMA: LMA inserted LMA Size: 5.0 Number of attempts: 1 Airway Equipment and Method: Bite block Placement Confirmation: positive ETCO2 Tube secured with: Tape Dental Injury: Teeth and Oropharynx as per pre-operative assessment

## 2018-06-19 NOTE — Anesthesia Preprocedure Evaluation (Addendum)
Anesthesia Evaluation  Patient identified by MRN, date of birth, ID band Patient awake    Reviewed: Allergy & Precautions, NPO status , Patient's Chart, lab work & pertinent test results  History of Anesthesia Complications (+) PONV and history of anesthetic complications  Airway Mallampati: II  TM Distance: >3 FB Neck ROM: Full    Dental  (+) Teeth Intact, Dental Advisory Given   Pulmonary sleep apnea and Continuous Positive Airway Pressure Ventilation ,    Pulmonary exam normal breath sounds clear to auscultation       Cardiovascular Exercise Tolerance: Good negative cardio ROS Normal cardiovascular exam Rhythm:Regular Rate:Normal     Neuro/Psych negative neurological ROS     GI/Hepatic negative GI ROS, Neg liver ROS,   Endo/Other  diabetes, Type 2, Oral Hypoglycemic AgentsObesity   Renal/GU negative Renal ROS     Musculoskeletal  (+) Arthritis , Osteoarthritis,    Abdominal   Peds  Hematology negative hematology ROS (+)   Anesthesia Other Findings Day of surgery medications reviewed with the patient.  Reproductive/Obstetrics                             Anesthesia Physical Anesthesia Plan  ASA: II  Anesthesia Plan: General   Post-op Pain Management:  Regional for Post-op pain   Induction: Intravenous  PONV Risk Score and Plan: 3 and Midazolam, Dexamethasone, Ondansetron, Scopolamine patch - Pre-op and Diphenhydramine  Airway Management Planned: LMA  Additional Equipment:   Intra-op Plan:   Post-operative Plan: Extubation in OR  Informed Consent: I have reviewed the patients History and Physical, chart, labs and discussed the procedure including the risks, benefits and alternatives for the proposed anesthesia with the patient or authorized representative who has indicated his/her understanding and acceptance.   Dental advisory given  Plan Discussed with:  CRNA  Anesthesia Plan Comments:        Anesthesia Quick Evaluation

## 2018-06-19 NOTE — Transfer of Care (Signed)
Immediate Anesthesia Transfer of Care Note  Patient: Eric Matthews  Procedure(s) Performed: OPEN REDUCTION INTERNAL FIXATION (ORIF) left ankle syndesmosis; repair of deltoid ligament (Left Ankle) SYNDESMOSIS REPAIR left ankle (Left Ankle)  Patient Location: PACU  Anesthesia Type:General and Regional  Level of Consciousness: awake and sedated  Airway & Oxygen Therapy: Patient Spontanous Breathing and Patient connected to face mask oxygen  Post-op Assessment: Report given to RN and Post -op Vital signs reviewed and stable  Post vital signs: Reviewed and stable  Last Vitals:  Vitals Value Taken Time  BP    Temp    Pulse    Resp    SpO2      Last Pain:  Vitals:   06/19/18 1210  TempSrc: Oral  PainSc: 2       Patients Stated Pain Goal: 1 (06/19/18 1210)  Complications: No apparent anesthesia complications

## 2018-06-19 NOTE — H&P (Signed)
Eric Matthews is an 52 y.o. male.   Chief Complaint: Left ankle pain HPI: The patient is a 52 year old male who injured his left ankle at work last week.  He stepped on somewhat at the bottom of stairs and twisted his ankle.  He describes a pronation external rotation pattern of injury.  Radiographs and a CT scan show a disruption of the syndesmosis as well as likely deltoid ligament rupture.  He also has a nondisplaced posterior malleolus fracture.  He presents now for operative treatment of these unstable ankle injuries.  Past Medical History:  Diagnosis Date  . Ankle syndesmosis disruption, left, initial encounter   . Arthritis   . Complication of anesthesia   . Diabetes mellitus without complication (HCC)   . Hypercholesteremia   . Hyperlipidemia with target low density lipoprotein (LDL) cholesterol less than 100 mg/dL   . PONV (postoperative nausea and vomiting)   . Sleep apnea    uses CPAP nightly    Past Surgical History:  Procedure Laterality Date  . CYST REMOVAL HAND Right    wrist ganglion    History reviewed. No pertinent family history. Social History:  reports that he has never smoked. He has never used smokeless tobacco. He reports current alcohol use. He reports that he does not use drugs.  Allergies:  Allergies  Allergen Reactions  . Codeine Nausea And Vomiting    Medications Prior to Admission  Medication Sig Dispense Refill  . aspirin EC 81 MG tablet Take 81 mg by mouth every other day.    . Cinnamon 500 MG TABS Take by mouth.    Marland Kitchen. HYDROcodone-acetaminophen (NORCO) 5-325 MG tablet Take 1 tablet by mouth every 6 (six) hours as needed for moderate pain. 15 tablet 0  . Krill Oil 1000 MG CAPS Take by mouth.    . metFORMIN (GLUCOPHAGE) 500 MG tablet Take by mouth daily with breakfast.    . pravastatin (PRAVACHOL) 20 MG tablet Take 20 mg by mouth daily.    . sitaGLIPtin (JANUVIA) 100 MG tablet Take 100 mg by mouth daily.    . Turmeric 500 MG CAPS Take by mouth.     . vitamin C (ASCORBIC ACID) 500 MG tablet Take 500 mg by mouth daily.      No results found for this or any previous visit (from the past 48 hour(s)). No results found.  ROS no recent fever, chills, nausea, vomiting or changes in his appetite  Blood pressure 125/71, pulse (!) 57, temperature 98.8 F (37.1 C), temperature source Oral, resp. rate 12, height 5\' 9"  (1.753 m), weight 112.4 kg, SpO2 98 %. Physical Exam  Well-nourished well-developed male in no apparent distress.  Alert and oriented x4.  Mood and affect are normal extraocular motions are intact.  Respirations are unlabored.  Gait is nonweightbearing on the left.  Left ankle is swollen and ecchymotic.  Skin wrinkles.  No blisters.  Pulses are palpable.  No lymphadenopathy.  Sensibility to light touch is intact dorsally and plantarly at the forefoot.  5 out of 5 strength in plantar flexion and dorsiflexion of the toes.  Tender to palpation at the syndesmosis anteriorly.  Assessment/Plan Left ankle syndesmosis disruption and probable deltoid ligament rupture. -To the operating room today for open treatment of the syndesmosis disruption with internal fixation and possible repair of the deltoid ligament.  The posterior malleolus fracture can be treated safely in closed fashion.  The risks and benefits of the alternative treatment options have been discussed in detail.  The patient wishes to proceed with surgery and specifically understands risks of bleeding, infection, nerve damage, blood clots, need for additional surgery, amputation and death.   Eric ArthursJohn Eric Solorio, MD 06/19/2018, 2:25 PM

## 2018-06-19 NOTE — Op Note (Signed)
06/19/2018  3:52 PM  PATIENT:  Eric Matthews  52 y.o. male  PRE-OPERATIVE DIAGNOSIS:  1.  Left ankle syndesmosis disruption 2.  Left deltoid ligament rupture 3.  Left proximal fibular fracture  POST-OPERATIVE DIAGNOSIS: Same  Procedure(s): 1.  Open treatment of left ankle syndesmosis disruption with internal fixation 2.  Stress examination of the left ankle under fluoroscopy 3.  Primary repair of left deltoid ligament rupture 4.  AP, mortise and lateral radiographs of the left ankle  SURGEON:  Toni ArthursJohn Affie Gasner, MD  ASSISTANT: None  ANESTHESIA:   General, regional  EBL:  minimal   TOURNIQUET:   Total Tourniquet Time Documented: Thigh (Left) - 52 minutes Total: Thigh (Left) - 52 minutes  COMPLICATIONS:  None apparent  DISPOSITION:  Extubated, awake and stable to recovery.  INDICATION FOR PROCEDURE: The patient is a 52 year old male who injured his left ankle at work last week.  He has a proximal fibula fracture and injury of his syndesmosis and likely a deltoid ligament rupture.  He presents now for open treatment of this unstable left ankle injury.  The risks and benefits of the alternative treatment options have been discussed in detail.  The patient wishes to proceed with surgery and specifically understands risks of bleeding, infection, nerve damage, blood clots, need for additional surgery, amputation and death.  PROCEDURE IN DETAIL:  After pre operative consent was obtained, and the correct operative site was identified, the patient was brought to the operating room and placed supine on the OR table.  Anesthesia was administered.  Pre-operative antibiotics were administered.  A surgical timeout was taken.  The left lower extremity was prepped and draped in standard sterile fashion with a tourniquet around the thigh.  The extremity was elevated and the tourniquet was inflated to 250 mmHg.  A longitudinal incision was made over the anterolateral fibula.  Dissection was carried down  through the subcutaneous tissues.  The syndesmosis was exposed and was noted to be completely disrupted with significant hematoma.  This was irrigated and all soft tissue debrided from the syndesmosis.  The syndesmosis was then reduced under direct vision and held with a King tong clamp.  AP and lateral radiographs confirmed appropriate reduction of the syndesmosis.  A 4 hole one third tubular plate was then contoured to fit the lateral malleolus.  It was positioned appropriately using fluoroscopic guidance.  2 [employer] zip tight anchors were inserted through all 4 cortices of the distal fibula and tibia that were tightened appropriately and the sutures trimmed.  The clamp was removed.  AP, mortise and lateral radiographs confirmed appropriate reduction of the syndesmosis.  Stress examination was then performed.  This revealed persistent instability at the medial gutter indicative of a deltoid ligament rupture.  A longitudinal incision was then made over the deltoid ligament.  Dissection was carried down through the subcutaneous tissues.  The superficial and deep portions of the deltoid were avulsed from the medial malleolus.  The cortical bone was roughened with a rongeur.  A 1.45 mm [employer] juggernaut anchor was inserted.  It was noted to have excellent purchase.  The anchor sutures were then passed through the deep layer of the deltoid ligament.  This was pulled up onto the medial malleolus and tied.  The suture was then passed through the superficial deltoid ligament and again pulled up onto the medial malleolus and tied.  Anterior and posterior portions of the joint capsule and posterior tibial tendon sheath were repaired with figure-of-eight sutures of 0  Vicryl.  Final AP, lateral and mortise radiographs confirmed appropriate reduction of the syndesmosis and ankle mortise.  The wounds were irrigated copiously and closed with 2-0 Vicryl and 3-0 nylon.  Sterile dressings were applied followed  by a well-padded short leg splint.  The tourniquet was released after application of the dressings at 52 minutes.  The patient was awakened from anesthesia and transported to the recovery room in stable condition.   FOLLOW UP PLAN: Nonweightbearing on the left lower extremity.  Follow-up in the office in 3 weeks for suture removal and conversion to a short leg cast.  Aspirin 81 mg p.o. twice daily for DVT prophylaxis.   RADIOGRAPHS: AP, lateral and mortise radiographs of the left ankle are obtained intraoperatively.  These show interval reduction and fixation of the syndesmosis and ankle mortise.

## 2018-06-19 NOTE — Discharge Instructions (Addendum)
Eric Hewitt, MD °[facility] ° °Please read the following information regarding your care after surgery. ° °Medications  °You only need a prescription for the narcotic pain medicine (ex. oxycodone, Percocet, Norco).  All of the other medicines listed below are available over the counter. °X Aleve 2 pills twice a day for the first 3 days after surgery. °X acetominophen (Tylenol) 650 mg every 4-6 hours as you need for minor to moderate pain °X oxycodone as prescribed for severe pain ° °Narcotic pain medicine (ex. oxycodone, Percocet, Vicodin) will cause constipation.  To prevent this problem, take the following medicines while you are taking any pain medicine. °X docusate sodium (Colace) 100 mg twice a day X senna (Senokot) 2 tablets twice a day ° °X To help prevent blood clots, take a baby aspirin (81 mg) twice a day for two weeks after surgery.  You should also get up every hour while you are awake to move around.   ° °Weight Bearing °? Bear weight when you are able on your operated leg or foot. °? Bear weight only on your operated foot in the post-op shoe. °X Do not bear any weight on the operated leg or foot. ° °Cast / Splint / Dressing °X Keep your splint, cast or dressing clean and dry.  Don’t put anything (coat hanger, pencil, etc) down inside of it.  If it gets damp, use a hair dryer on the cool setting to dry it.  If it gets soaked, call the office to schedule an appointment for a cast change. °? Remove your dressing 3 days after surgery and cover the incisions with dry dressings.   ° °After your dressing, cast or splint is removed; you may shower, but do not soak or scrub the wound.  Allow the water to run over it, and then gently pat it dry. ° °Swelling °It is normal for you to have swelling where you had surgery.  To reduce swelling and pain, keep your toes above your nose for at least 3 days after surgery.  It may be necessary to keep your foot or leg elevated for several weeks.  If it hurts,  it should be elevated. ° °Follow Up °Call my office at [phone] when you are discharged from the hospital or surgery center to schedule an appointment to be seen two weeks after surgery. ° °Call my office at [phone] if you develop a fever >101.5° F, nausea, vomiting, bleeding from the surgical site or severe pain.   ° ° ° °Post Anesthesia Home Care Instructions ° °Activity: °Get plenty of rest for the remainder of the day. A responsible individual must stay with you for 24 hours following the procedure.  °For the next 24 hours, DO NOT: °-Drive a car °-Operate machinery °-Drink alcoholic beverages °-Take any medication unless instructed by your physician °-Make any legal decisions or sign important papers. ° °Meals: °Start with liquid foods such as gelatin or soup. Progress to regular foods as tolerated. Avoid greasy, spicy, heavy foods. If nausea and/or vomiting occur, drink only clear liquids until the nausea and/or vomiting subsides. Call your physician if vomiting continues. ° °Special Instructions/Symptoms: °Your throat may feel dry or sore from the anesthesia or the breathing tube placed in your throat during surgery. If this causes discomfort, gargle with warm salt water. The discomfort should disappear within 24 hours. ° °If you had a scopolamine patch placed behind your ear for the management of post- operative nausea and/or vomiting: ° °1. The medication in the   patch is effective for 72 hours, after which it should be removed.  Wrap patch in a tissue and discard in the trash. Wash hands thoroughly with soap and water. °2. You may remove the patch earlier than 72 hours if you experience unpleasant side effects which may include dry mouth, dizziness or visual disturbances. °3. Avoid touching the patch. Wash your hands with soap and water after contact with the patch. °  ° ° °Regional Anesthesia Blocks ° °1. Numbness or the inability to move the "blocked" extremity may last from 3-48 hours after  placement. The length of time depends on the medication injected and your individual response to the medication. If the numbness is not going away after 48 hours, call your surgeon. ° °2. The extremity that is blocked will need to be protected until the numbness is gone and the  Strength has returned. Because you cannot feel it, you will need to take extra care to avoid injury. Because it may be weak, you may have difficulty moving it or using it. You may not know what position it is in without looking at it while the block is in effect. ° °3. For blocks in the legs and feet, returning to weight bearing and walking needs to be done carefully. You will need to wait until the numbness is entirely gone and the strength has returned. You should be able to move your leg and foot normally before you try and bear weight or walk. You will need someone to be with you when you first try to ensure you do not fall and possibly risk injury. ° °4. Bruising and tenderness at the needle site are common side effects and will resolve in a few days. ° °5. Persistent numbness or new problems with movement should be communicated to the surgeon or the [facility] ([phone])/ [facility] ([phone]). °

## 2018-06-19 NOTE — Anesthesia Procedure Notes (Signed)
Anesthesia Regional Block: Popliteal block   Pre-Anesthetic Checklist: ,, timeout performed, Correct Patient, Correct Site, Correct Laterality, Correct Procedure, Correct Position, site marked, Risks and benefits discussed,  Surgical consent,  Pre-op evaluation,  At surgeon's request and post-op pain management  Laterality: Lower and Left  Prep: chloraprep       Needles:  Injection technique: Single-shot  Needle Type: Echogenic Stimulator Needle     Needle Length: 9cm  Needle Gauge: 21     Additional Needles:   Procedures:,,,, ultrasound used (permanent image in chart),,,,  Narrative:  Start time: 06/19/2018 1:17 PM End time: 06/19/2018 1:22 PM Injection made incrementally with aspirations every 5 mL.  Performed by: Personally  Anesthesiologist: Cecile Hearingurk, Johnanthony Wilden Edward, MD  Additional Notes: No pain on injection. No increased resistance to injection. Injection made in 5cc increments.  Good needle visualization.  Patient tolerated procedure well.

## 2018-06-22 ENCOUNTER — Encounter (HOSPITAL_BASED_OUTPATIENT_CLINIC_OR_DEPARTMENT_OTHER): Payer: Self-pay | Admitting: Orthopedic Surgery

## 2018-07-03 LAB — POCT I-STAT, CHEM 8
BUN: 17 mg/dL (ref 6–20)
Calcium, Ion: 1.17 mmol/L (ref 1.15–1.40)
Chloride: 107 mmol/L (ref 98–111)
Creatinine, Ser: 0.9 mg/dL (ref 0.61–1.24)
Glucose, Bld: 139 mg/dL — ABNORMAL HIGH (ref 70–99)
HCT: 43 % (ref 39.0–52.0)
Hemoglobin: 14.6 g/dL (ref 13.0–17.0)
Potassium: 4.4 mmol/L (ref 3.5–5.1)
Sodium: 140 mmol/L (ref 135–145)
TCO2: 25 mmol/L (ref 22–32)

## 2020-12-02 IMAGING — CT CT ANKLE*L* W/O CM
2 of 7 series · 15 of 33 positions shown, 18 images · non-contrast
Comparison: Radiographs same date.

CLINICAL DATA: Fall from steps.  Left ankle fracture.

EXAM:
CT OF THE LEFT ANKLE WITHOUT CONTRAST
TECHNIQUE: Multidetector CT imaging of the left ankle was performed according
to the standard protocol. Multiplanar CT image reconstructions were
also generated.

[Series 4: lower ext 1.5 st · axial · 0.41mm/px · z∈[+88,+264]mm · 10 of 139 slices shown, 13 images]
[im 11/139  soft-tissue]
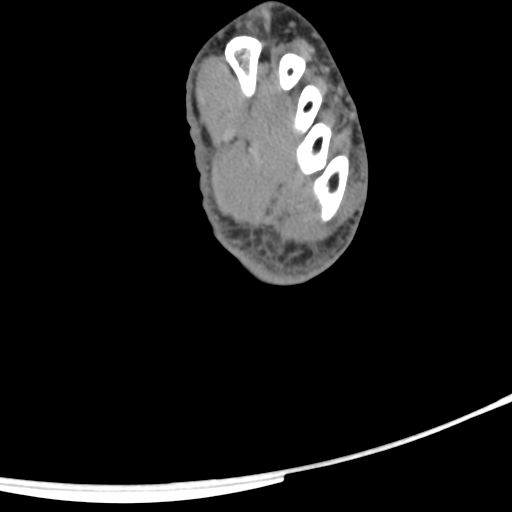
[im 11/139  bone]
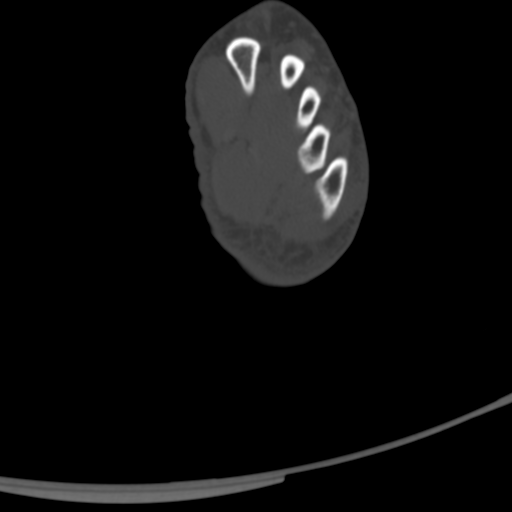
[im 22/139  bone]
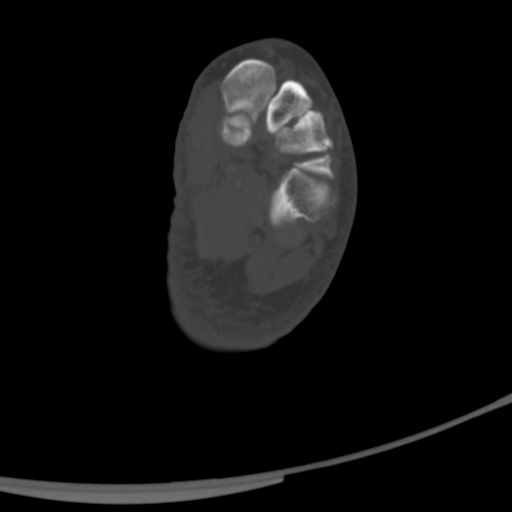
[im 43/139  bone]
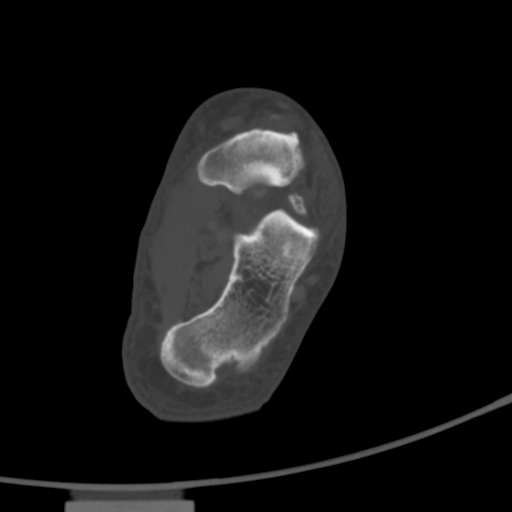
[im 54/139  bone]
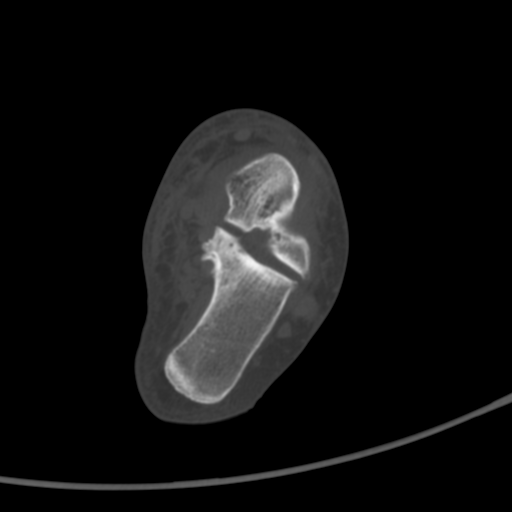
[im 64/139  soft-tissue]
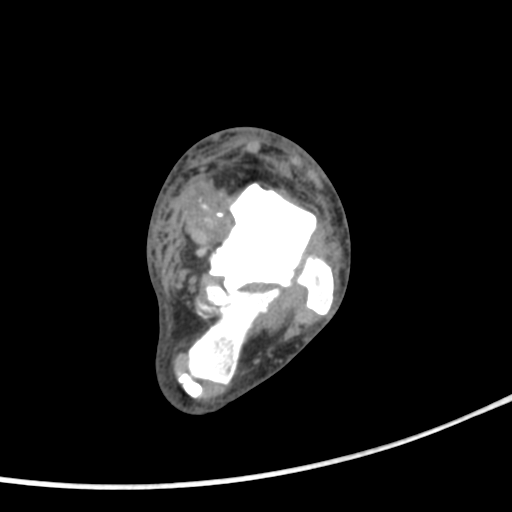
[im 64/139  bone]
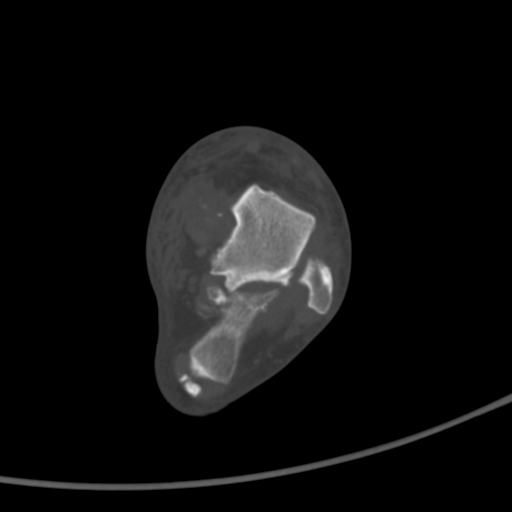
[im 75/139  bone]
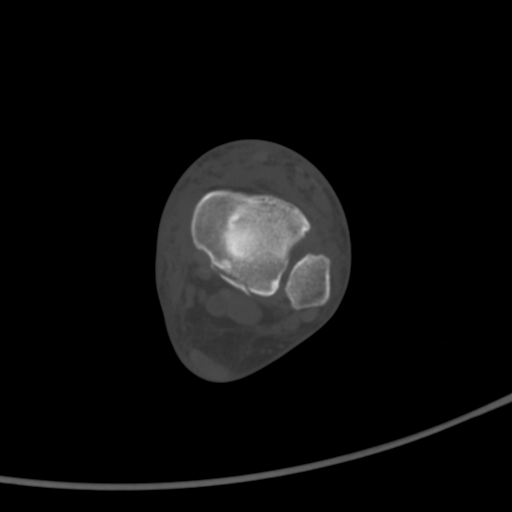
[im 85/139  bone]
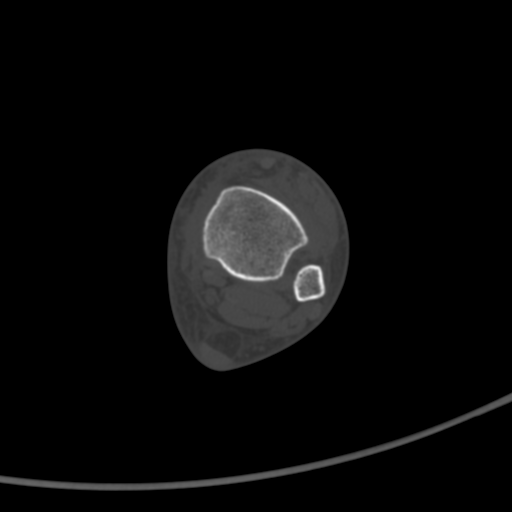
[im 107/139  bone]
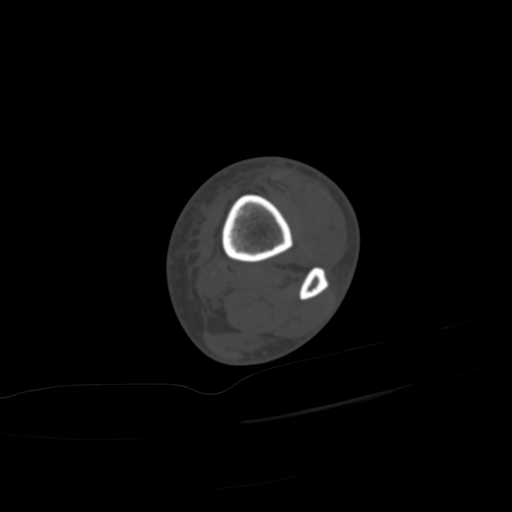
[im 117/139  soft-tissue]
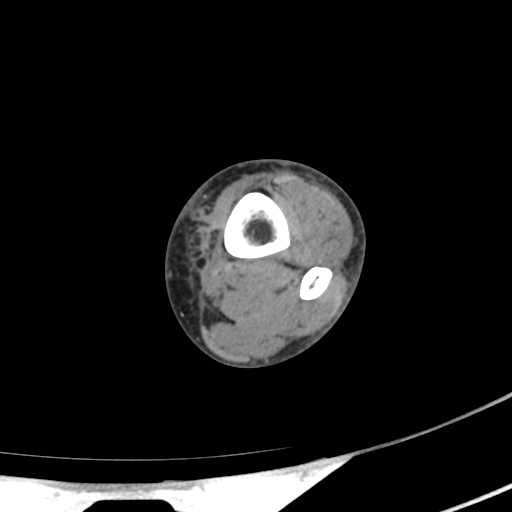
[im 117/139  bone]
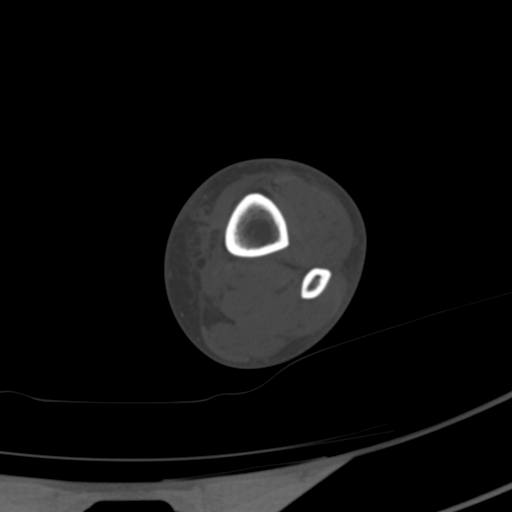
[im 128/139  bone]
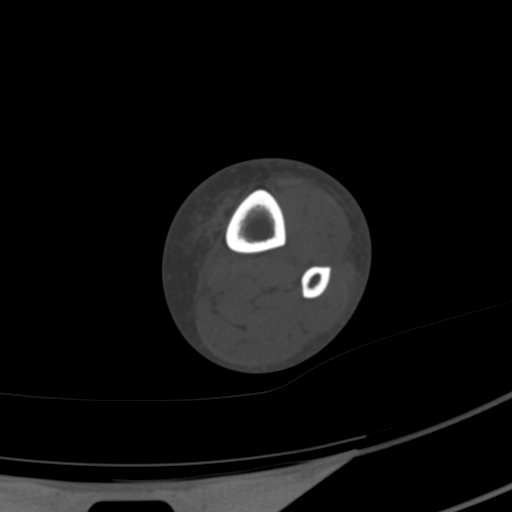

[Series 10: lower ext sag st · sagittal · 0.46mm/px · 5 of 162 slices shown]
[im 27/162  bone]
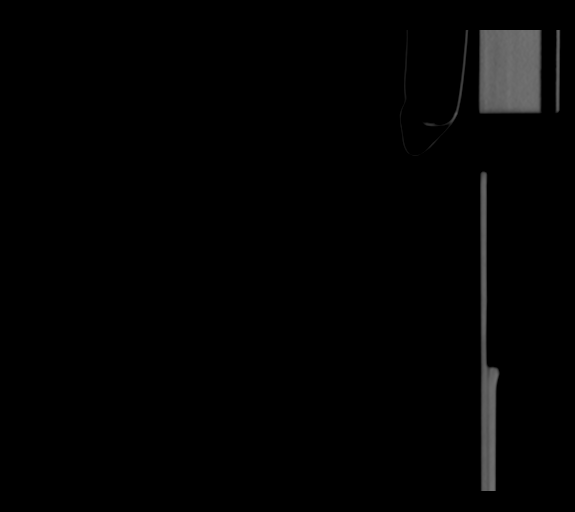
[im 54/162  bone]
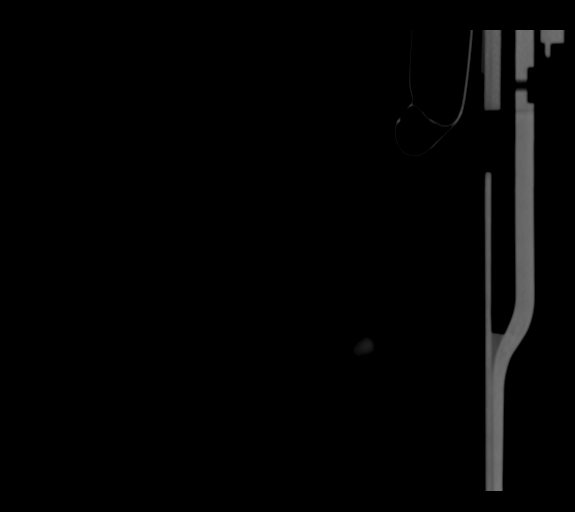
[im 81/162  bone]
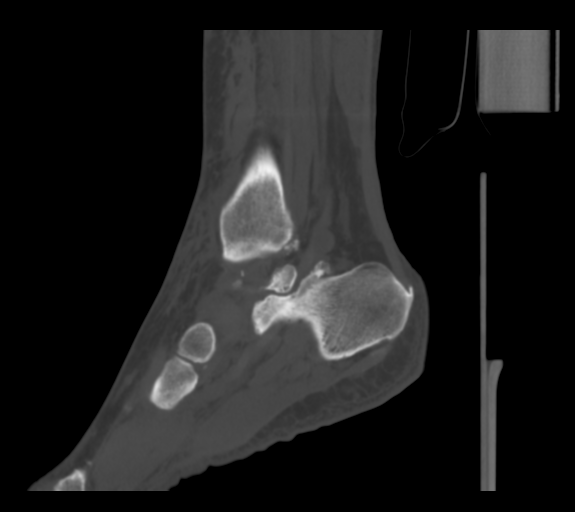
[im 108/162  bone]
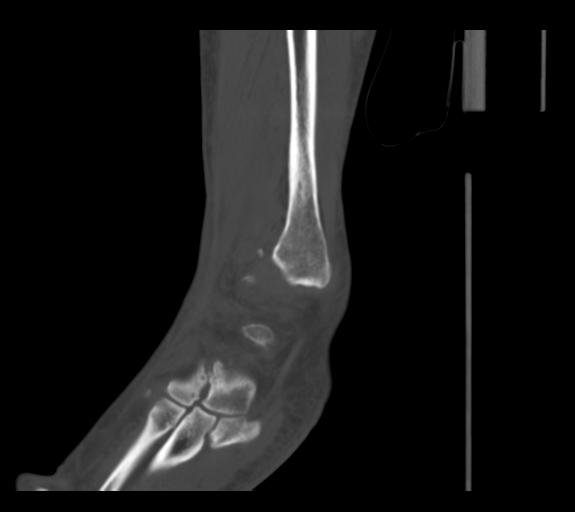
[im 135/162  bone]
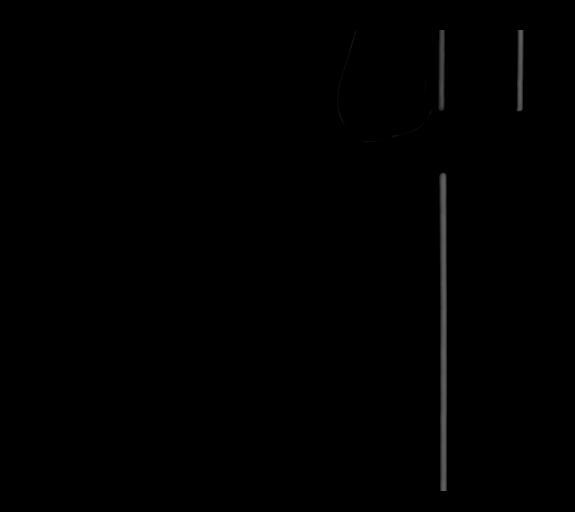

[15 of 33 positions shown; findings below may reference images not displayed]

FINDINGS: Bones/Joint/Cartilage

There is a comminuted and mildly displaced intra-articular fracture
involving the posterior aspect of the tibial plafond. This fracture
extends towards the base of the medial malleolus, although does not
involve it. Well corticated ossific density distal to the medial
malleolus appears nonacute. There is no extension of the fracture
into the distal tibiofibular joint which is mildly widened. There is
also mild widening of the interval between the medial malleolus and
the talus. Slightly angulated 4 mm ossific density anterior to the
distal fibula may reflect an acute avulsion fracture, probably
mediated by the anterior inferior tibiofibular ligament. No clear
donor site identified. The distal fibula otherwise appears normal.

There are possible tiny avulsion fractures along the navicular
tuberosity. No other acute tarsal bone fractures are identified. The
talar dome is intact. There are mild midfoot degenerative changes
with fragmented osteophytes. Degenerative changes are present within
the subtalar joint.

Ligaments

Suboptimally assessed by CT. As above, possible lateral avulsion
fracture mediated by the anterior inferior tibiofibular ligament.
Grossly intact anterior talofibular ligament.

Muscles and Tendons

The ankle tendons appear intact without evidence of entrapment.

Soft tissues

Moderate medial and mild lateral soft tissue swelling without focal
hematoma.
IMPRESSION: 1. Comminuted and mildly displaced intra-articular fracture of the
posterior tibial plafond peripherally.
2. Possible small avulsion fracture anterolaterally without clear
donor site. Mild associated widening of the ankle mortise,
suggesting possible ligamentous injury.
3. Possible tiny avulsion fractures from the navicular tuberosity.
Intact talar dome and additional tarsal bones.
4. Hindfoot and midfoot degenerative changes.
